# Patient Record
Sex: Female | Born: 1990 | Race: Black or African American | Hispanic: No | Marital: Single | Smoking: Current every day smoker
Health system: Southern US, Community
[De-identification: ages and names within clinical notes are randomized; demographics above are authoritative.]

## PROBLEM LIST (undated history)

## (undated) ENCOUNTER — Ambulatory Visit: Admission: EM | Payer: Self-pay | Source: Home / Self Care

## (undated) DIAGNOSIS — I10 Essential (primary) hypertension: Secondary | ICD-10-CM

## (undated) DIAGNOSIS — D649 Anemia, unspecified: Secondary | ICD-10-CM

## (undated) DIAGNOSIS — F419 Anxiety disorder, unspecified: Secondary | ICD-10-CM

## (undated) HISTORY — PX: WISDOM TOOTH EXTRACTION: SHX21

---
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
## 2010-10-05 ENCOUNTER — Ambulatory Visit: Payer: Self-pay | Admitting: Family Medicine

## 2012-04-22 ENCOUNTER — Emergency Department: Payer: Self-pay

## 2012-05-25 IMAGING — CR DG LUMBAR SPINE AP/LAT/OBLIQUES W/ FLEX AND EXT
1 series · 7 of 7 positions shown · non-contrast
Comparison: none

REASON FOR EXAM: back pain
COMMENTS:

[Series 1: view not recorded · 0.17mm/px · 7 of 7 slices shown]
[im 1/7]
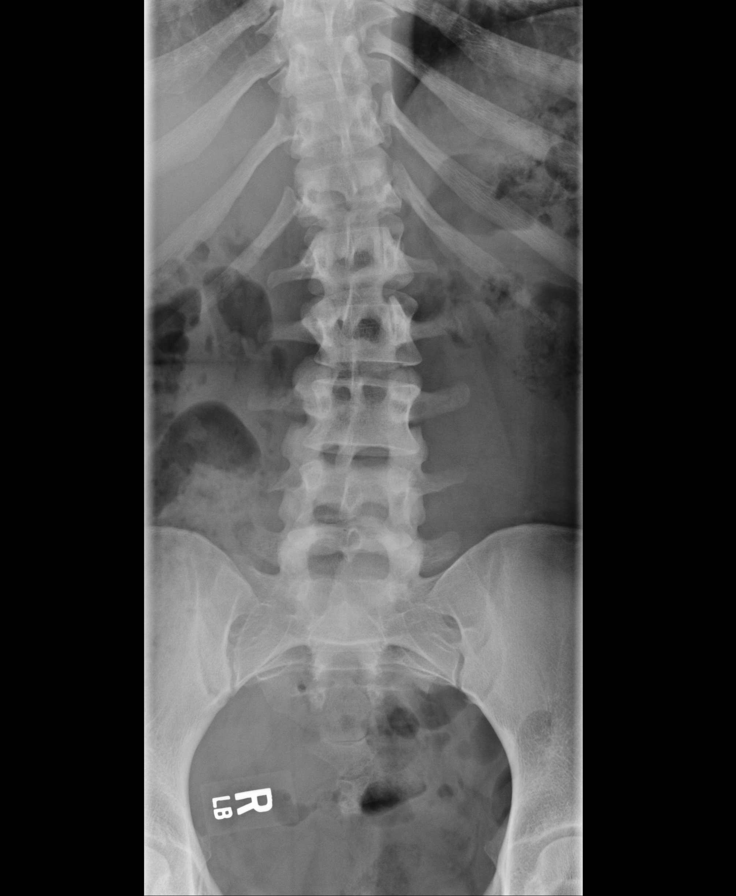
[im 2/7]
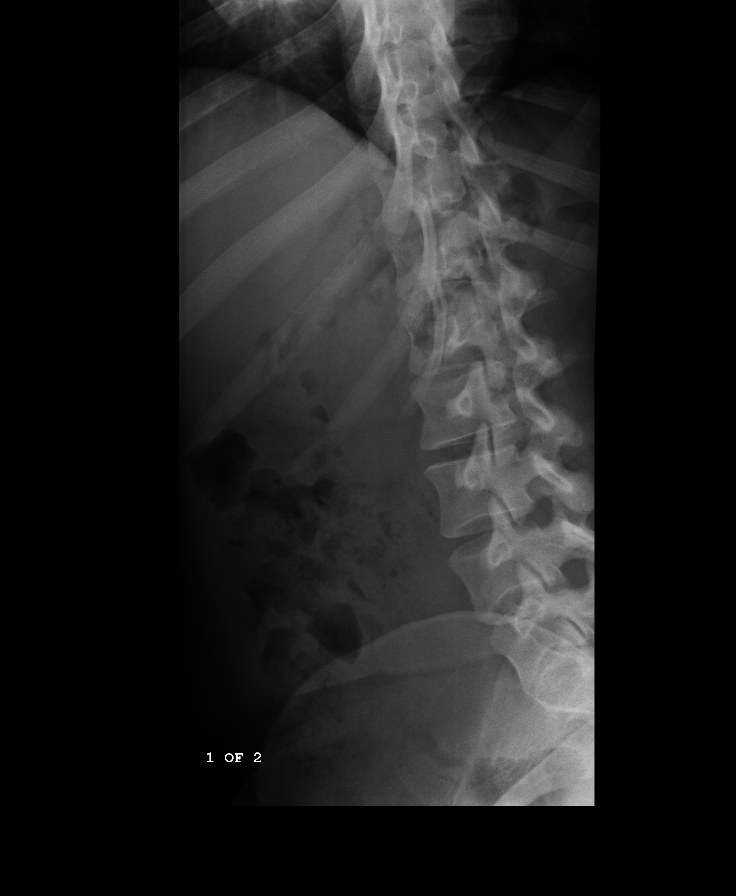
[im 3/7]
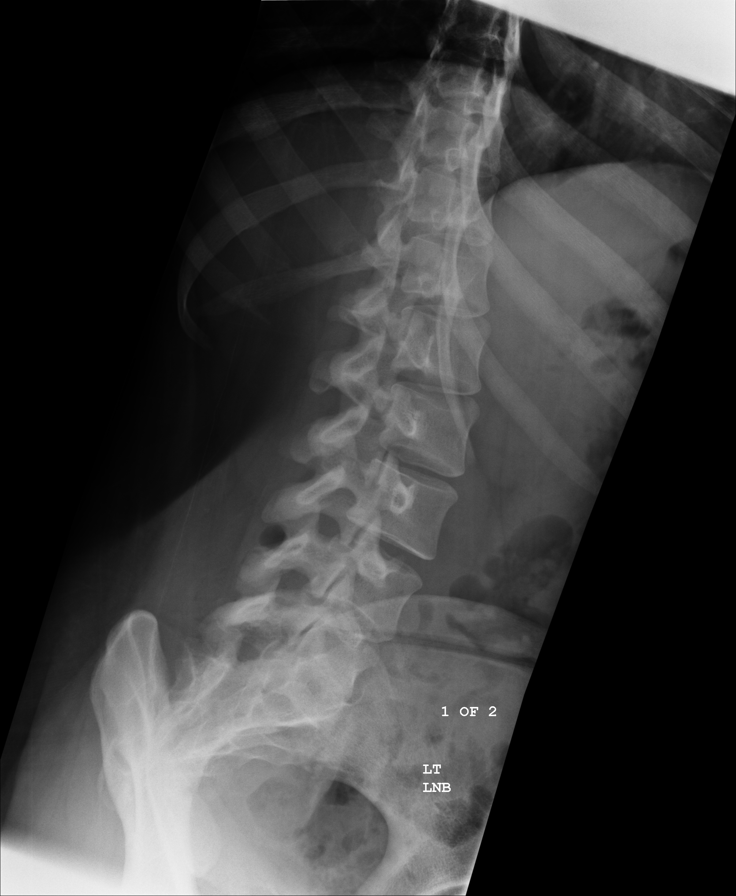
[im 4/7]
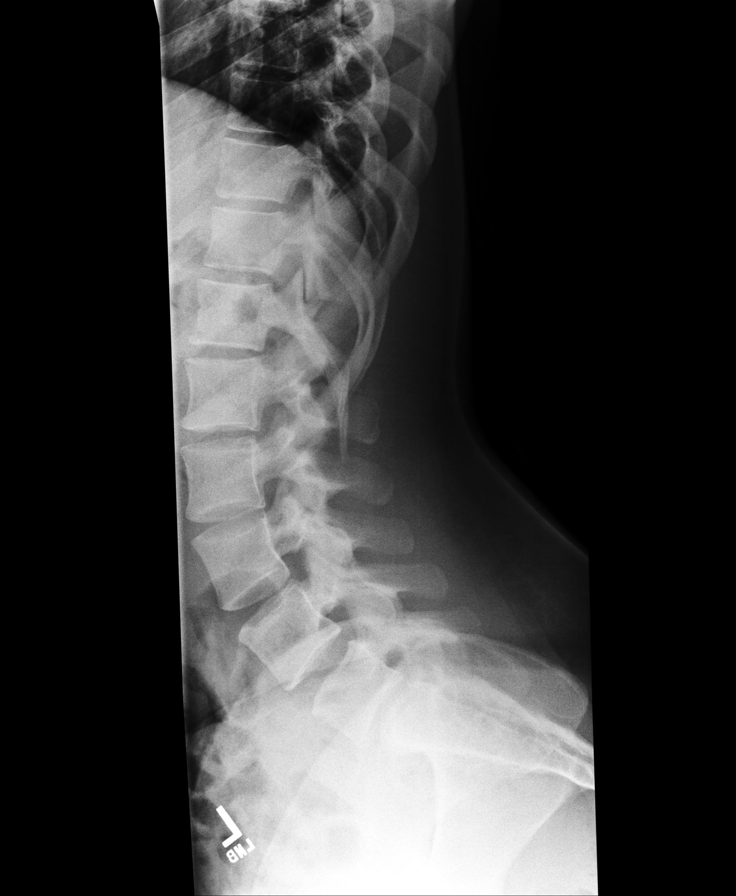
[im 5/7]
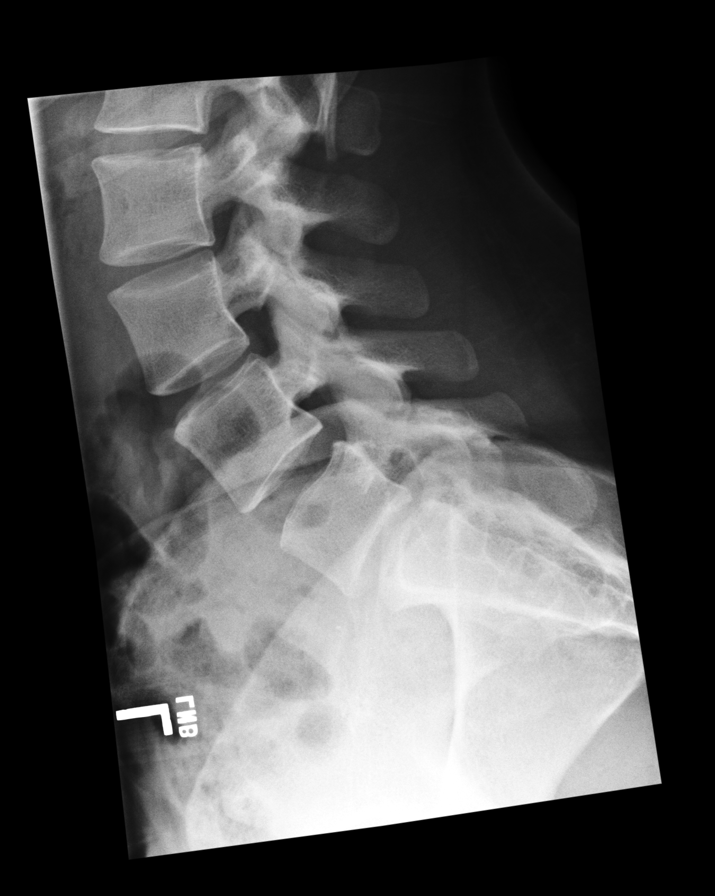
[im 6/7]
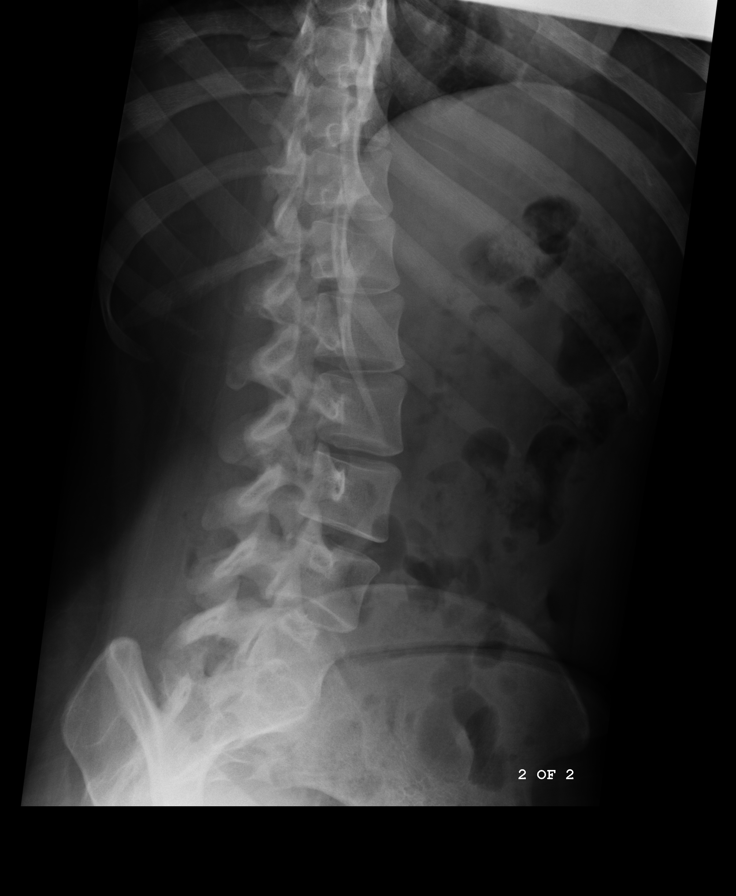
[im 7/7]
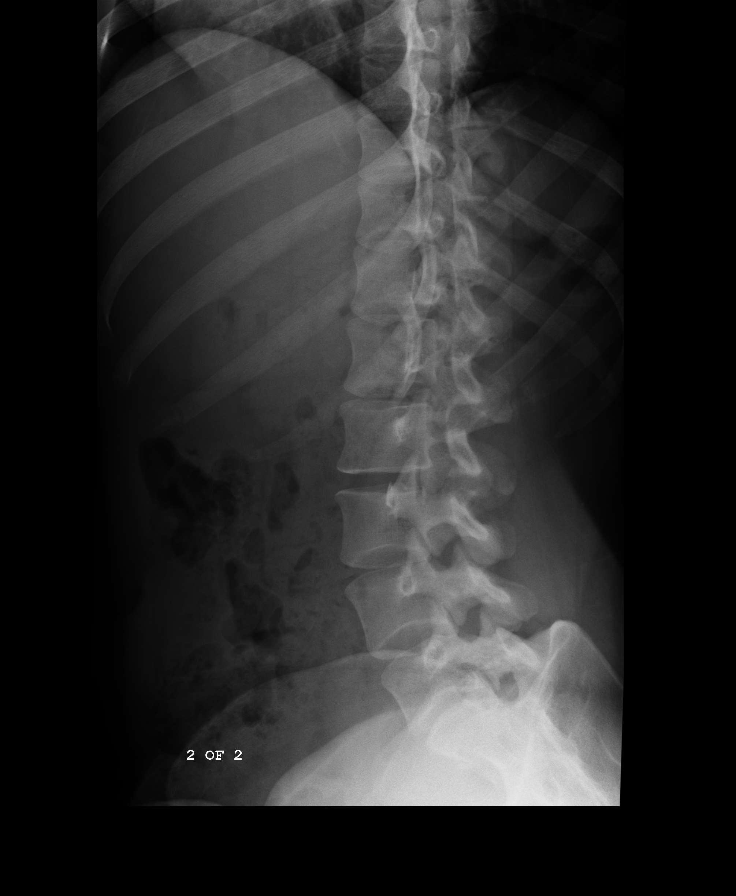

[7 of 7 positions shown; findings below may reference images not displayed]

PROCEDURE:     DXR - DXR LUMBAR SPINE WITH OBLIQUES  - October 05, 2010  [DATE]

RESULT:     The vertebral body heights and the intervertebral disc spaces
are well maintained. The vertebral body alignment is normal. Oblique views
show no significant abnormalities of the articular facets. There is a lumbar
scoliosis centered at L2 with the convexity to the left and measuring
approximately 14 degrees. The pedicles and transverse processes are normal
in appearance.
IMPRESSION: 1.  No fracture or other acute bony abnormality is identified.
2.  There is a compensatory lumbar scoliosis with a convexity to the left
and measuring approximately 14 degrees.

## 2012-05-25 IMAGING — CR DG THORACIC SPINE 2-3V
1 series · 2 of 2 positions shown · non-contrast
Comparison: none

REASON FOR EXAM: back pain
COMMENTS:

[Series 1: view not recorded · 0.17mm/px · 2 of 2 slices shown]
[im 1/2]
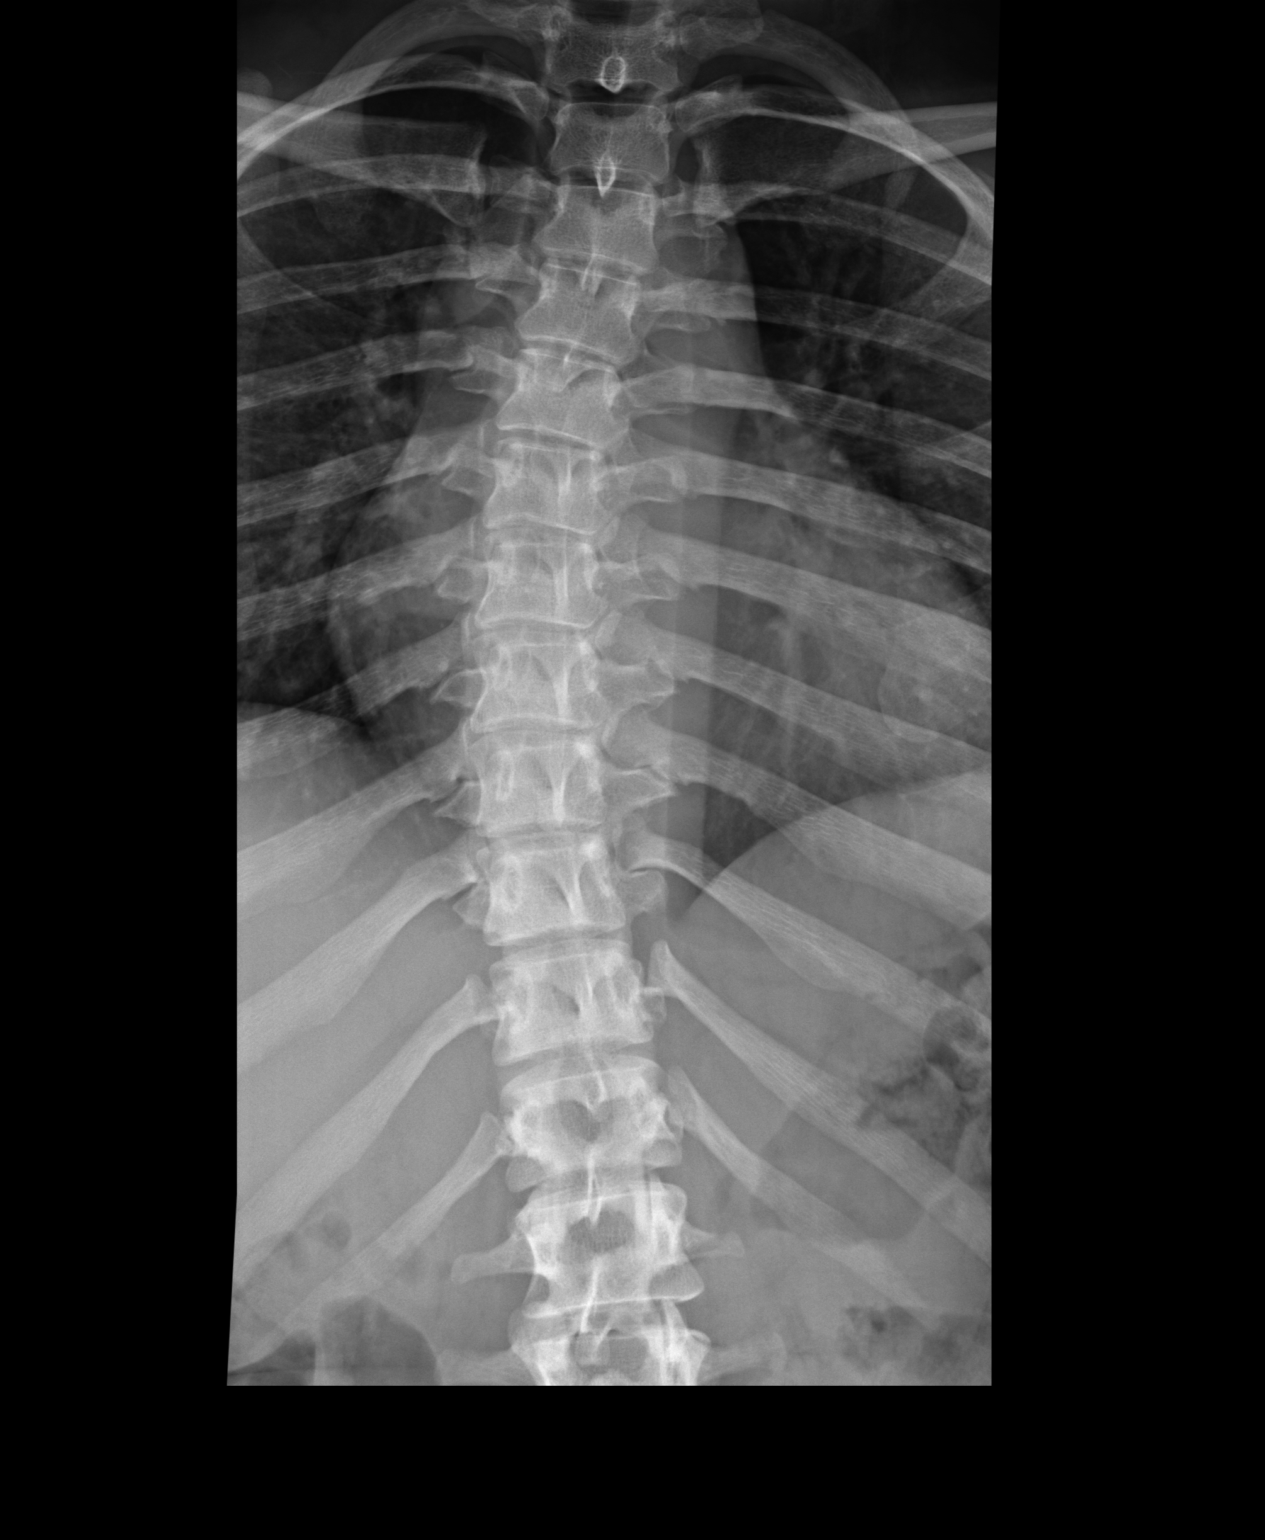
[im 2/2]
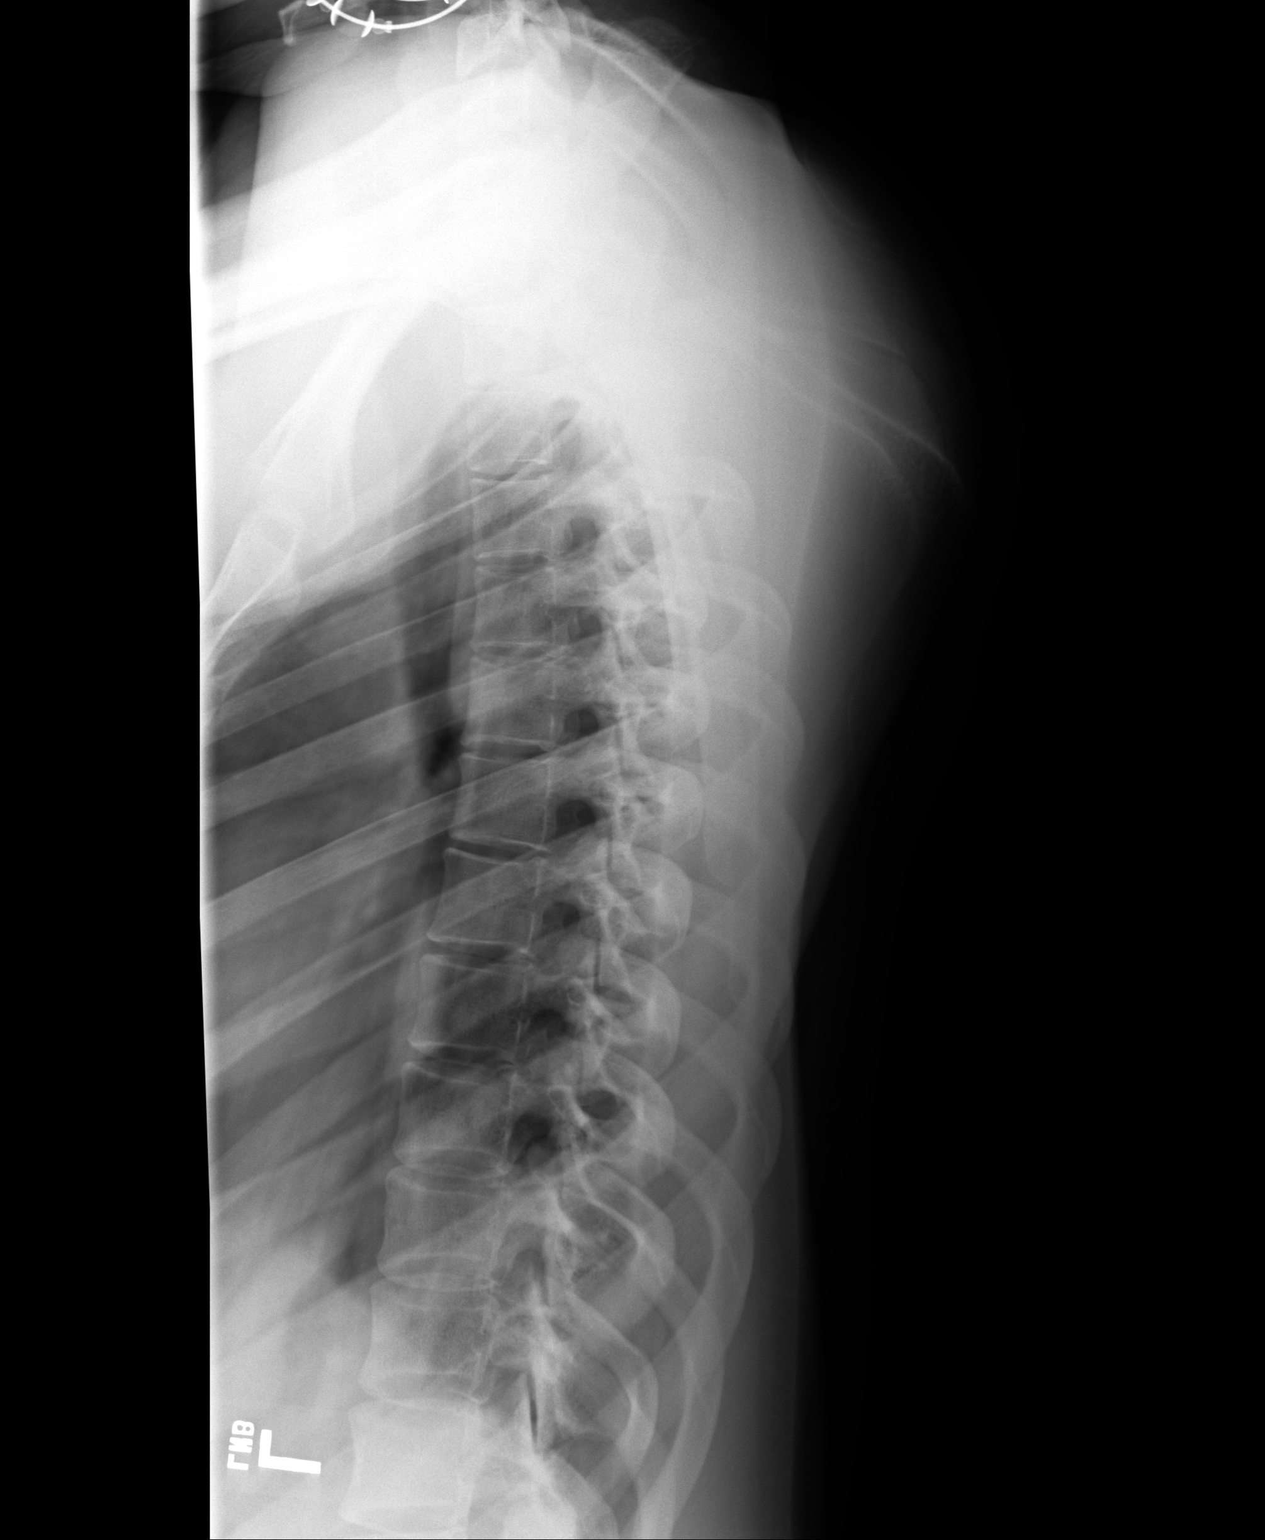

[2 of 2 positions shown; findings below may reference images not displayed]

PROCEDURE:     DXR - DXR THORACIC  AP AND LATERAL  - October 05, 2010  [DATE]

RESULT:     The vertebral body heights and the intervertebral disc spaces
are well maintained. The vertebral body alignment is normal. There is a
lumbar scoliosis, centered approximately at the T7-8 thoracic interspace and
having convexity to the right. The curvature measures approximately 16
degrees. The pedicles are bilaterally intact.
IMPRESSION: 1.  No fracture or other acute bony abnormality is identified.
2.  There is a thoracic scoliosis as noted above.

## 2013-12-19 ENCOUNTER — Emergency Department: Payer: Self-pay | Admitting: Emergency Medicine

## 2017-10-02 ENCOUNTER — Other Ambulatory Visit: Payer: Self-pay

## 2017-10-02 ENCOUNTER — Emergency Department
Admission: EM | Admit: 2017-10-02 | Discharge: 2017-10-02 | Disposition: A | Discharge status: Home / Self Care | Payer: Self-pay | Attending: Student in an Organized Health Care Education/Training Program | Admitting: Student in an Organized Health Care Education/Training Program

## 2017-10-02 ENCOUNTER — Emergency Department: Payer: Self-pay

## 2017-10-02 ENCOUNTER — Encounter: Payer: Self-pay | Admitting: Emergency Medicine

## 2017-10-02 DIAGNOSIS — R0602 Shortness of breath: Secondary | ICD-10-CM | POA: Insufficient documentation

## 2017-10-02 DIAGNOSIS — R079 Chest pain, unspecified: Secondary | ICD-10-CM | POA: Insufficient documentation

## 2017-10-02 LAB — BASIC METABOLIC PANEL
ANION GAP: 7 (ref 5–15)
BUN: 12 mg/dL (ref 6–20)
CO2: 25 mmol/L (ref 22–32)
Calcium: 8.7 mg/dL — ABNORMAL LOW (ref 8.9–10.3)
Chloride: 104 mmol/L (ref 101–111)
Creatinine, Ser: 0.92 mg/dL (ref 0.44–1.00)
GFR calc Af Amer: >60 mL/min (ref >60)
GFR calc non Af Amer: >60 mL/min (ref >60)
GLUCOSE: 88 mg/dL (ref 65–99)
POTASSIUM: 3.7 mmol/L (ref 3.5–5.1)
Sodium: 136 mmol/L (ref 135–145)

## 2017-10-02 LAB — CBC
HEMATOCRIT: 36.4 % (ref 35.0–47.0)
HEMOGLOBIN: 12.5 g/dL (ref 12.0–16.0)
MCH: 34.3 pg — AB (ref 26.0–34.0)
MCHC: 34.4 g/dL (ref 32.0–36.0)
MCV: 99.8 fL (ref 80.0–100.0)
Platelets: 256 10*3/uL (ref 150–440)
RBC: 3.65 MIL/uL — ABNORMAL LOW (ref 3.80–5.20)
RDW: 13.6 % (ref 11.5–14.5)
WBC: 4.6 10*3/uL (ref 3.6–11.0)

## 2017-10-02 LAB — TROPONIN I: Troponin I: <0.03 ng/mL (ref <0.03)

## 2017-10-02 LAB — POCT PREGNANCY, URINE: Preg Test, Ur: NEGATIVE

## 2017-10-02 LAB — FIBRIN DERIVATIVES D-DIMER (ARMC ONLY): Fibrin derivatives D-dimer (ARMC): 244.33 ng/mL (FEU) (ref 0.00–499.00)

## 2017-10-02 MED ORDER — IPRATROPIUM-ALBUTEROL 0.5-2.5 (3) MG/3ML IN SOLN
RESPIRATORY_TRACT | Status: AC
  Administered 2017-10-02: 3 mL via RESPIRATORY_TRACT
  Filled 2017-10-02: qty 3

## 2017-10-02 MED ORDER — IPRATROPIUM-ALBUTEROL 0.5-2.5 (3) MG/3ML IN SOLN
3.0000 mL | Freq: Once | RESPIRATORY_TRACT | Status: AC
  Administered 2017-10-02: 3 mL via RESPIRATORY_TRACT

## 2017-10-02 MED ORDER — ALBUTEROL SULFATE HFA 108 (90 BASE) MCG/ACT IN AERS: 2.0000 | INHALATION_SPRAY | Freq: Four times a day (QID) | RESPIRATORY_TRACT | 2 refills | Status: AC | PRN

## 2017-10-02 MED ORDER — DIAZEPAM 5 MG PO TABS: 5.0000 mg | ORAL_TABLET | Freq: Two times a day (BID) | ORAL | 0 refills | Status: AC | PRN

## 2017-10-02 NOTE — ED Triage Notes (Signed)
Pt reports that she has been having chest pain for a month mostly when she goes to bed but today when she was at work, she noticed it. States that she has had SOB and noticed fluttering in her chest. States that she notices more when she is laying down. Denies Nausea, diaphoresis or radiation.

## 2017-10-02 NOTE — ED Provider Notes (Signed)
[facility] Emergency Department Provider Note    First MD Initiated Contact with Patient 10/02/17 1740     (approximate)  I have reviewed the triage vital signs and the nursing notes.   HISTORY  Chief Complaint Chest Pain and Shortness of Breath    HPI Marijean BravoDekendra A Leventhal is a 27 y.o. female no significant past medical history presents the ER with intermittent chest pain particular at night for the past month that is gotten worse today.  States that she came to the ER today while she was at work because she started having similar chest pain shortness of breath.  Any recent travel.  Was recently on birth control pills for the past 2 months and feels that that may have coincided with her onset of symptoms.  Denies any fever or cough.   History reviewed. No pertinent past medical history. No family history on file. History reviewed. No pertinent surgical history. There are no active problems to display for this patient.     Prior to Admission medications   Medication Sig Start Date End Date Taking? Authorizing Provider  diazepam (VALIUM) 5 MG tablet Take 1 tablet (5 mg total) by mouth 3 times/day as needed-between meals & bedtime for anxiety. 10/02/17 10/02/18  Willy Eddyobinson, Khrystal Jeanmarie, MD    Allergies Patient has no allergy information on record.    Social History Social History   Tobacco Use  . Smoking status: Never Smoker  Substance Use Topics  . Alcohol use: Yes    Comment: socially  . Drug use: Not on file    Review of Systems Patient denies headaches, rhinorrhea, blurry vision, numbness, shortness of breath, chest pain, edema, cough, abdominal pain, nausea, vomiting, diarrhea, dysuria, fevers, rashes or hallucinations unless otherwise stated above in HPI. ____________________________________________   PHYSICAL EXAM:  VITAL SIGNS: Vitals:   10/02/17 1758 10/02/17 1759  BP: (!) 133/98 (!) 133/98  Pulse: 68 68  Resp:  18  Temp:    SpO2:  100% 100%    Constitutional: Alert and oriented.  Eyes: Conjunctivae are normal.  Head: Atraumatic. Nose: No congestion/rhinnorhea. Mouth/Throat: Mucous membranes are moist.   Neck: No stridor. Painless ROM.  Cardiovascular: Normal rate, regular rhythm. Grossly normal heart sounds.  Good peripheral circulation. Respiratory: Normal respiratory effort.  No retractions. Lungs CTAB. Gastrointestinal: Soft and nontender. No distention. No abdominal bruits. No CVA tenderness. Genitourinary:  Musculoskeletal: No lower extremity tenderness nor edema.  No joint effusions. Neurologic:  Normal speech and language. No gross focal neurologic deficits are appreciated. No facial droop Skin:  Skin is warm, dry and intact. No rash noted. Psychiatric: Mood and affect are normal. Speech and behavior are normal.  ____________________________________________   LABS (all labs ordered are listed, but only abnormal results are displayed)  Results for orders placed or performed during the hospital encounter of 10/02/17 (from the past 24 hour(s))  Basic metabolic panel     Status: Abnormal   Collection Time: 10/02/17  3:49 PM  Result Value Ref Range   Sodium 136 135 - 145 mmol/L   Potassium 3.7 3.5 - 5.1 mmol/L   Chloride 104 101 - 111 mmol/L   CO2 25 22 - 32 mmol/L   Glucose, Bld 88 65 - 99 mg/dL   BUN 12 6 - 20 mg/dL   Creatinine, Ser 1.610.92 0.44 - 1.00 mg/dL   Calcium 8.7 (L) 8.9 - 10.3 mg/dL   GFR calc non Af Amer >60 >60 mL/min   GFR calc Af Amer >  60 >60 mL/min   Anion gap 7 5 - 15  CBC     Status: Abnormal   Collection Time: 10/02/17  3:49 PM  Result Value Ref Range   WBC 4.6 3.6 - 11.0 K/uL   RBC 3.65 (L) 3.80 - 5.20 MIL/uL   Hemoglobin 12.5 12.0 - 16.0 g/dL   HCT 16.1 09.6 - 04.5 %   MCV 99.8 80.0 - 100.0 fL   MCH 34.3 (H) 26.0 - 34.0 pg   MCHC 34.4 32.0 - 36.0 g/dL   RDW 40.9 81.1 - 91.4 %   Platelets 256 150 - 440 K/uL  Troponin I     Status: None   Collection Time: 10/02/17  3:49 PM   Result Value Ref Range   Troponin I <0.03 <0.03 ng/mL  Pregnancy, urine POC     Status: None   Collection Time: 10/02/17  6:06 PM  Result Value Ref Range   Preg Test, Ur NEGATIVE NEGATIVE  Fibrin derivatives D-Dimer ([facility] only)     Status: None   Collection Time: 10/02/17  6:47 PM  Result Value Ref Range   Fibrin derivatives D-dimer ([facility]) 244.33 0.00 - 499.00 ng/mL (FEU)   ____________________________________________  EKG My review and personal interpretation at Time: 15:44   Indication: chest pain  Rate: 75  Rhythm: sinus Axis: normal Other: normal intervals, no stemi ____________________________________________  RADIOLOGY  I personally reviewed all radiographic images ordered to evaluate for the above acute complaints and reviewed radiology reports and findings.  These findings were personally discussed with the patient.  Please see medical record for radiology report.  ____________________________________________   PROCEDURES  Procedure(s) performed:  Procedures    Critical Care performed: no ____________________________________________   INITIAL IMPRESSION / ASSESSMENT AND PLAN / ED COURSE  Pertinent labs & imaging results that were available during my care of the patient were reviewed by me and considered in my medical decision making (see chart for details).   DDX: bronchitis, anxiety, bronchitis, pna, ptx, pe, acs  Niaomi A Belmonte is a 27 y.o. who presents to the ED with symptoms as described above.  Patient afebrile and hemodynamically stable.  No hypoxia.  EKG shows no evidence of acute ischemia.  Troponin is negative.  Patient low risk by Anner Crete but she is on birth control therefore d-dimer was sent to further stratify for pulmonary embolism.  D-dimer came back normal.  Patient states that she does feel stressed and that she has had intermittent episodes of this since she lost her father 2 years ago.  Do suspect some component of anxiety or stress-induced  symptomatology.  This point do believe she stable and appropriate for outpatient follow-up.      As part of my medical decision making, I reviewed the following data within the electronic MEDICAL RECORD NUMBER Nursing notes reviewed and incorporated, Labs reviewed, notes from prior ED visits .   ____________________________________________   FINAL CLINICAL IMPRESSION(S) / ED DIAGNOSES  Final diagnoses:  Chest pain, unspecified type      NEW MEDICATIONS STARTED DURING THIS VISIT:  New Prescriptions   DIAZEPAM (VALIUM) 5 MG TABLET    Take 1 tablet (5 mg total) by mouth 3 times/day as needed-between meals & bedtime for anxiety.     Note:  This document was prepared using Dragon voice recognition software and may include unintentional dictation errors.    Willy Eddy, MD 10/02/17 [phone]

## 2017-10-02 NOTE — ED Notes (Signed)
Pt ambulatory to POV without difficulty. VSS. NAD. Discharge instructions, RX and follow up given. All questions and concerns addressed. 

## 2019-01-15 ENCOUNTER — Other Ambulatory Visit: Payer: Self-pay

## 2019-01-15 ENCOUNTER — Emergency Department
Admission: EM | Admit: 2019-01-15 | Discharge: 2019-01-15 | Payer: Self-pay | Attending: Emergency Medicine | Admitting: Emergency Medicine

## 2019-01-15 DIAGNOSIS — Z008 Encounter for other general examination: Secondary | ICD-10-CM

## 2019-01-15 DIAGNOSIS — Z0279 Encounter for issue of other medical certificate: Secondary | ICD-10-CM | POA: Insufficient documentation

## 2019-01-15 DIAGNOSIS — F17210 Nicotine dependence, cigarettes, uncomplicated: Secondary | ICD-10-CM | POA: Insufficient documentation

## 2019-01-15 DIAGNOSIS — I10 Essential (primary) hypertension: Secondary | ICD-10-CM | POA: Insufficient documentation

## 2019-01-15 HISTORY — DX: Essential (primary) hypertension: I10

## 2019-01-15 HISTORY — DX: Anxiety disorder, unspecified: F41.9

## 2019-01-15 NOTE — ED Triage Notes (Addendum)
Patient coming to this ED in custody of Sheriff's department for COVID test. 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 Patient reports cough and runny nose X 1 week.  Patient report she has already had a negative COVID test this week. Patient is refusing to have a COVID test/swab. Sheriff's department is requesting one to clear her for jail as she refused to let them take her temperature.

## 2019-01-15 NOTE — Discharge Instructions (Addendum)
Patient was seen and evaluated in the emergency department.  Patient appears well, patient's vitals are reassuring including no fever.  She has clear lung sounds without any clinical signs of pneumonia or upper respiratory infection.  I believe the patient is safe for discharge into police custody.  Minimal risk of COVID.

## 2019-01-15 NOTE — ED Provider Notes (Signed)
[facility] Emergency Department Provider Note  Time seen: 3:30 AM  I have reviewed the triage vital signs and the nursing notes.   HISTORY  Chief Complaint COVID test   HPI Bridget Sanchez is a 28 y.o. female with a past medical history of anxiety, hypertension, presents to the emergency department for COVID evaluation.  According to police patient was placed under arrest tonight however refused to let them take her temperature at the jail and when asked if she had any covered symptoms that she had been coughing but refused to take a COVID test so they brought the patient here for evaluation.  Here patient overall appears well, vitals are reassuring including a negative temperature 98.1.  Patient states she has had a runny nose and a cough for the past week but states a week ago she had a COVID test performed that was negative per patient.  Patient denies any known COVID positive contacts although states possible exposure through her work.  Past Medical History:  Diagnosis Date  . Anxiety   . Hypertension     There are no active problems to display for this patient.   Past Surgical History:  Procedure Laterality Date  . WISDOM TOOTH EXTRACTION      Prior to Admission medications   Medication Sig Start Date End Date Taking? Authorizing Provider  albuterol (PROVENTIL HFA;VENTOLIN HFA) 108 (90 Base) MCG/ACT inhaler Inhale 2 puffs into the lungs every 6 (six) hours as needed for wheezing or shortness of breath. 10/02/17   Merlyn Lot, MD    No Known Allergies  No family history on file.  Social History Social History   Tobacco Use  . Smoking status: Current Every Day Smoker  . Smokeless tobacco: Never Used  Substance Use Topics  . Alcohol use: Yes    Comment: socially  . Drug use: Not on file    Review of Systems Constitutional: Negative for fever. Cardiovascular: Negative for chest pain. Respiratory: Negative for shortness of breath.   Cough x1 week per patient. Gastrointestinal: Negative for abdominal pain Neurological: Negative for headache All other ROS negative  ____________________________________________   PHYSICAL EXAM:  VITAL SIGNS: ED Triage Vitals  Enc Vitals Group     BP 01/15/19 0259 (!) 159/93     Pulse Rate 01/15/19 0259 (!) 110     Resp 01/15/19 0259 19     Temp 01/15/19 0259 98.1 F (36.7 C)     Temp Source 01/15/19 0259 Oral     SpO2 01/15/19 0259 99 %     Weight 01/15/19 0300 175 lb (79.4 kg)     Height 01/15/19 0300 5\' 8"  (1.727 m)     Head Circumference --      Peak Flow --      Pain Score 01/15/19 0300 0     Pain Loc --      Pain Edu? --      Excl. in [place]? --    Constitutional: Alert and oriented. Well appearing and in no distress.  Tearful. Eyes: Normal exam ENT      Head: Normocephalic and atraumatic      Mouth/Throat: Mucous membranes are moist. Cardiovascular: Normal rate, regular rhythm.  Respiratory: Normal respiratory effort without tachypnea nor retractions. Breath sounds are clear Gastrointestinal: Soft and nontender. No distention. Musculoskeletal: Nontender with normal range of motion in all extremities.  Neurologic:  Normal speech and language. No gross focal neurologic deficits  Skin:  Skin is warm, dry and intact.  Psychiatric: Mood and affect are normal.   ____________________________________________   INITIAL IMPRESSION / ASSESSMENT AND PLAN / ED COURSE  Pertinent labs & imaging results that were available during my care of the patient were reviewed by me and considered in my medical decision making (see chart for details).   Patient presents emergency department by police for COVID evaluation.  They state the patient refused to allow them to check her temperature and they could not clear her for jail so they brought her here.  Here the patient allowed [place] to take vitals, patient is afebrile.  Allowed a physical exam which shows clear lung sounds without any sign  of wheeze rales or rhonchi.  I offered to repeat the COVID test here, patient continues to refuse to cover test states she had one a week ago and it hurt and she does not want another one.  At this time given her normal physical exam with clear lung sounds no cough at least during my evaluation and afebrile vitals I believe the patient is safe for discharge into police custody.  Bridget Sanchez was evaluated in Emergency Department on 01/15/2019 for the symptoms described in the history of present illness. She was evaluated in the context of the global COVID-19 pandemic, which necessitated consideration that the patient might be at risk for infection with the SARS-CoV-2 virus that causes COVID-19. Institutional protocols and algorithms that pertain to the evaluation of patients at risk for COVID-19 are in a state of rapid change based on information released by regulatory bodies including the CDC and federal and state organizations. These policies and algorithms were followed during the patient's care in the ED.  ____________________________________________   FINAL CLINICAL IMPRESSION(S) / ED DIAGNOSES  Medical clearance   Minna Antis, MD 01/15/19 [phone]

## 2019-01-15 NOTE — ED Notes (Signed)
Pt sitting in exam room with no distress noted, mask in place; in custody of Therapist, nutritional who [facility] jail is requiring COVID test as pt reported recent cough & congestion; pt reports -COVID test this wk and refuses another; resp even/unlab, lungs clear

## 2019-03-22 ENCOUNTER — Other Ambulatory Visit: Payer: Self-pay

## 2019-03-22 DIAGNOSIS — Z20822 Contact with and (suspected) exposure to covid-19: Secondary | ICD-10-CM

## 2019-03-23 LAB — NOVEL CORONAVIRUS, NAA: SARS-CoV-2, NAA: DETECTED — AB

## 2019-07-09 ENCOUNTER — Emergency Department
Admission: EM | Admit: 2019-07-09 | Discharge: 2019-07-10 | Disposition: A | Discharge status: Home / Self Care | Payer: Self-pay | Attending: Emergency Medicine | Admitting: Emergency Medicine

## 2019-07-09 ENCOUNTER — Other Ambulatory Visit: Payer: Self-pay

## 2019-07-09 DIAGNOSIS — Y908 Blood alcohol level of 240 mg/100 ml or more: Secondary | ICD-10-CM | POA: Insufficient documentation

## 2019-07-09 DIAGNOSIS — R4589 Other symptoms and signs involving emotional state: Secondary | ICD-10-CM

## 2019-07-09 DIAGNOSIS — R45850 Homicidal ideations: Secondary | ICD-10-CM

## 2019-07-09 DIAGNOSIS — F10929 Alcohol use, unspecified with intoxication, unspecified: Secondary | ICD-10-CM | POA: Diagnosis present

## 2019-07-09 DIAGNOSIS — F17210 Nicotine dependence, cigarettes, uncomplicated: Secondary | ICD-10-CM | POA: Insufficient documentation

## 2019-07-09 DIAGNOSIS — F329 Major depressive disorder, single episode, unspecified: Secondary | ICD-10-CM | POA: Insufficient documentation

## 2019-07-09 DIAGNOSIS — R45851 Suicidal ideations: Secondary | ICD-10-CM | POA: Insufficient documentation

## 2019-07-09 DIAGNOSIS — F1094 Alcohol use, unspecified with alcohol-induced mood disorder: Secondary | ICD-10-CM | POA: Diagnosis present

## 2019-07-09 DIAGNOSIS — F10920 Alcohol use, unspecified with intoxication, uncomplicated: Secondary | ICD-10-CM

## 2019-07-09 DIAGNOSIS — I10 Essential (primary) hypertension: Secondary | ICD-10-CM | POA: Insufficient documentation

## 2019-07-09 LAB — ACETAMINOPHEN LEVEL: Acetaminophen (Tylenol), Serum: <10 ug/mL — ABNORMAL LOW (ref 10–30)

## 2019-07-09 LAB — COMPREHENSIVE METABOLIC PANEL
ALT: 15 U/L (ref 0–44)
AST: 32 U/L (ref 15–41)
Albumin: 4.7 g/dL (ref 3.5–5.0)
Alkaline Phosphatase: 31 U/L — ABNORMAL LOW (ref 38–126)
Anion gap: 14 (ref 5–15)
BUN: 10 mg/dL (ref 6–20)
CO2: 20 mmol/L — ABNORMAL LOW (ref 22–32)
Calcium: 9.1 mg/dL (ref 8.9–10.3)
Chloride: 107 mmol/L (ref 98–111)
Creatinine, Ser: 0.98 mg/dL (ref 0.44–1.00)
GFR calc Af Amer: >60 mL/min (ref >60)
GFR calc non Af Amer: >60 mL/min (ref >60)
Glucose, Bld: 91 mg/dL (ref 70–99)
Potassium: 3.4 mmol/L — ABNORMAL LOW (ref 3.5–5.1)
Sodium: 141 mmol/L (ref 135–145)
Total Bilirubin: 0.3 mg/dL (ref 0.3–1.2)
Total Protein: 8.3 g/dL — ABNORMAL HIGH (ref 6.5–8.1)

## 2019-07-09 LAB — URINE DRUG SCREEN, QUALITATIVE (ARMC ONLY)
Amphetamines, Ur Screen: NOT DETECTED
Barbiturates, Ur Screen: NOT DETECTED
Benzodiazepine, Ur Scrn: NOT DETECTED
Cannabinoid 50 Ng, Ur ~~LOC~~: POSITIVE — AB
Cocaine Metabolite,Ur ~~LOC~~: NOT DETECTED
MDMA (Ecstasy)Ur Screen: NOT DETECTED
Methadone Scn, Ur: NOT DETECTED
Opiate, Ur Screen: NOT DETECTED
Phencyclidine (PCP) Ur S: NOT DETECTED
Tricyclic, Ur Screen: NOT DETECTED

## 2019-07-09 LAB — CBC
HCT: 34.1 % — ABNORMAL LOW (ref 36.0–46.0)
Hemoglobin: 10.8 g/dL — ABNORMAL LOW (ref 12.0–15.0)
MCH: 29.8 pg (ref 26.0–34.0)
MCHC: 31.7 g/dL (ref 30.0–36.0)
MCV: 93.9 fL (ref 80.0–100.0)
Platelets: 372 10*3/uL (ref 150–400)
RBC: 3.63 MIL/uL — ABNORMAL LOW (ref 3.87–5.11)
RDW: 14.7 % (ref 11.5–15.5)
WBC: 7.6 10*3/uL (ref 4.0–10.5)
nRBC: 0 % (ref 0.0–0.2)

## 2019-07-09 LAB — ETHANOL: Alcohol, Ethyl (B): 291 mg/dL — ABNORMAL HIGH (ref <10)

## 2019-07-09 LAB — SALICYLATE LEVEL: Salicylate Lvl: <7 mg/dL — ABNORMAL LOW (ref 7.0–30.0)

## 2019-07-09 NOTE — ED Notes (Signed)
Pt. Requested and was given cup of ginger ale.  Pt. Given policy on changing into hospital approved attire.  Pt. Here under IVC.  Pt. Denies SI/HI and A/VH.  Pt. Tearful upon interview and asking how long process will be.  Pt. Told staff and this nurse would do everything possible to move process along while maintaining safe environment.
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
## 2019-07-09 NOTE — ED Notes (Addendum)
Belongings placed in labeled belongings bag to be secured on the unit.  Black colored wallet, cell phone, red colored hoodie with white colored letters (L, A), jeans, tennis shoes teal colored and pink colored, gray colored bra, multicolored underwear.

## 2019-07-09 NOTE — ED Notes (Signed)
Patient not wanting to dress out in approved hospital attire because it is not specific in that it states patient must take off all of her clothing including bra and underwear.  Explained to patient would be provided scrubs, underwear, and socks but no bra as it could be used to harm self or others.

## 2019-07-10 DIAGNOSIS — F10920 Alcohol use, unspecified with intoxication, uncomplicated: Secondary | ICD-10-CM

## 2019-07-10 DIAGNOSIS — F1094 Alcohol use, unspecified with alcohol-induced mood disorder: Secondary | ICD-10-CM

## 2019-07-10 DIAGNOSIS — R45850 Homicidal ideations: Secondary | ICD-10-CM

## 2019-07-10 DIAGNOSIS — R4589 Other symptoms and signs involving emotional state: Secondary | ICD-10-CM

## 2019-07-10 DIAGNOSIS — F10929 Alcohol use, unspecified with intoxication, unspecified: Secondary | ICD-10-CM | POA: Diagnosis present

## 2019-07-10 NOTE — Discharge Instructions (Signed)
[facility] (Mental Health & [facility]) & [facility]  Mental health service in [place],  Address: [address], [place], [place] [address] Hours:  Closed ? Opens 8AM Mon Phone: [phone] 

## 2019-07-10 NOTE — ED Notes (Signed)
This nurse returned patients phone so patient could reschedule appointments.  Pt. Is a Copywriter, advertising.  Pt. Allowed to only use text feature for 10 minutes.  Pt. Also given policy on visitation and phone use.
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
## 2019-07-10 NOTE — ED Notes (Signed)
NP Talking to patient in room.

## 2019-07-10 NOTE — ED Provider Notes (Signed)
[facility] Emergency Department Provider Note  ____________________________________________  Time seen: Approximately 1:29 AM  I have reviewed the triage vital signs and the nursing notes.   HISTORY  Chief Complaint Psychiatric Evaluation   HPI Bridget Sanchez is a 29 y.o. female with a history of anxiety and hypertension who is brought in by police  under IVC for making statements about killing herself, her boyfriend and his girlfriend. Patient is intoxicated with positive alcohol level and UDS positive for cannabinoids. Patient is refusing to talk to me at this time and lays in bed with her eyes closed. Will reassess at a later time.  _________________________ 2:45 AM on 07/10/2019 -----------------------------------------  Patient is now awake and able to talk to [place].  She reports that she was at a friend's house when the cops showed up with an IVC order to bring her to the hospital.  She reports that she has no idea who called or took IVC papers on her.  She denies having a boyfriend or ex-boyfriend.  She denies making any threats about her life for someone else's life.  She denies any suicidal homicidal thoughts.  She denies any other drug use other than marijuana and alcohol.  She denies any prior history of suicide attempt.  Past Medical History:  Diagnosis Date  . Anxiety   . Hypertension     Past Surgical History:  Procedure Laterality Date  . WISDOM TOOTH EXTRACTION      Prior to Admission medications   Medication Sig Start Date End Date Taking? Authorizing Provider  albuterol (PROVENTIL HFA;VENTOLIN HFA) 108 (90 Base) MCG/ACT inhaler Inhale 2 puffs into the lungs every 6 (six) hours as needed for wheezing or shortness of breath. 10/02/17   Merlyn Lot, MD    Allergies Patient has no known allergies.  No family history on file.  Social History Social History   Tobacco Use  . Smoking status: Current Every Day Smoker  .  Smokeless tobacco: Never Used  Substance Use Topics  . Alcohol use: Yes    Comment: socially  . Drug use: Not on file    Review of Systems  Constitutional: Negative for fever. Eyes: Negative for visual changes. ENT: Negative for sore throat. Neck: No neck pain  Cardiovascular: Negative for chest pain. Respiratory: Negative for shortness of breath. Gastrointestinal: Negative for abdominal pain, vomiting or diarrhea. Genitourinary: Negative for dysuria. Musculoskeletal: Negative for back pain. Skin: Negative for rash. Neurological: Negative for headaches, weakness or numbness. Psych: No SI or HI  ____________________________________________   PHYSICAL EXAM:  VITAL SIGNS: ED Triage Vitals  Enc Vitals Group     BP 07/09/19 2222 (!) 144/105     Pulse Rate 07/09/19 2222 (!) 141     Resp 07/09/19 2222 18     Temp 07/09/19 2222 98.9 F (37.2 C)     Temp Source 07/09/19 2222 Oral     SpO2 07/09/19 2222 95 %     Weight 07/09/19 2231 175 lb (79.4 kg)     Height 07/09/19 2231 5\' 8"  (1.727 m)     Head Circumference --      Peak Flow --      Pain Score 07/09/19 2230 0     Pain Loc --      Pain Edu? --      Excl. in [place]? --     Constitutional: Alert and oriented. Well appearing and in no apparent distress. HEENT:      Head: Normocephalic and  atraumatic.         Eyes: Conjunctivae are normal. Sclera is non-icteric.       Mouth/Throat: Mucous membranes are moist.       Neck: Supple with no signs of meningismus. Cardiovascular: Regular rate and rhythm.  Respiratory: Normal respiratory effort.  Gastrointestinal: Soft, non tender, and non distended. Musculoskeletal: No edema, cyanosis, or erythema of extremities. Neurologic: Normal speech and language. Face is symmetric. Moving all extremities. No gross focal neurologic deficits are appreciated. Skin: Skin is warm, dry and intact. No rash noted. Psychiatric: Mood and affect are normal. Speech and behavior are  normal.  ____________________________________________   LABS (all labs ordered are listed, but only abnormal results are displayed)  Labs Reviewed  COMPREHENSIVE METABOLIC PANEL - Abnormal; Notable for the following components:      Result Value   Potassium 3.4 (*)    CO2 20 (*)    Total Protein 8.3 (*)    Alkaline Phosphatase 31 (*)    All other components within normal limits  ETHANOL - Abnormal; Notable for the following components:   Alcohol, Ethyl (B) 291 (*)    All other components within normal limits  SALICYLATE LEVEL - Abnormal; Notable for the following components:   Salicylate Lvl <7.0 (*)    All other components within normal limits  ACETAMINOPHEN LEVEL - Abnormal; Notable for the following components:   Acetaminophen (Tylenol), Serum <10 (*)    All other components within normal limits  CBC - Abnormal; Notable for the following components:   RBC 3.63 (*)    Hemoglobin 10.8 (*)    HCT 34.1 (*)    All other components within normal limits  URINE DRUG SCREEN, QUALITATIVE ([facility] ONLY) - Abnormal; Notable for the following components:   Cannabinoid 50 Ng, Ur [place] POSITIVE (*)    All other components within normal limits  POC URINE PREG, ED   ____________________________________________  EKG  none  ____________________________________________  RADIOLOGY  none  ____________________________________________   PROCEDURES  Procedure(s) performed: None Procedures Critical Care performed:  None ____________________________________________   INITIAL IMPRESSION / ASSESSMENT AND PLAN / ED COURSE  29 y.o. female with a history of anxiety and hypertension who is brought in by police  under IVC for making statements about killing herself, her boyfriend and his girlfriend.  Patient denies suicidal homicidal ideation, denies making threats to anyone.  She is able to contract for her safety.  Labs for medical clearance showing positive alcohol level of 291 and positive  UDS for cannabinoids.  Psych has been consulted.  Patient is medically clear.  The patient has been placed in psychiatric observation due to the need to provide a safe environment for the patient while obtaining psychiatric consultation and evaluation, as well as ongoing medical and medication management to treat the patient's condition.  The patient has been placed under full IVC at this time.       Please note:  Patient was evaluated in Emergency Department today for the symptoms described in the history of present illness. Patient was evaluated in the context of the global COVID-19 pandemic, which necessitated consideration that the patient might be at risk for infection with the SARS-CoV-2 virus that causes COVID-19. Institutional protocols and algorithms that pertain to the evaluation of patients at risk for COVID-19 are in a state of rapid change based on information released by regulatory bodies including the CDC and federal and state organizations. These policies and algorithms were followed during the patient's care in the  ED.  Some ED evaluations and interventions may be delayed as a result of limited staffing during the pandemic.   As part of my medical decision making, I reviewed the following data within the electronic MEDICAL RECORD NUMBER Nursing notes reviewed and incorporated, Labs reviewed , Old chart reviewed, A consult was requested and obtained from this/these consultant(s) psychiatry, Notes from prior ED visits and [place] Controlled Substance Database   ____________________________________________   FINAL CLINICAL IMPRESSION(S) / ED DIAGNOSES   Final diagnoses:  Alcoholic intoxication with complication (HCC)      NEW MEDICATIONS STARTED DURING THIS VISIT:  ED Discharge Orders    None       Note:  This document was prepared using Dragon voice recognition software and may include unintentional dictation errors.    Don Perking, [place], MD 07/10/19 [phone]
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
## 2019-07-10 NOTE — BH Assessment (Signed)
Patient refusing to talk to anyone at this time. Patient BAL is 291 and UDS is positive for cannabis. Will attempt to reassess at later time.
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
## 2019-07-10 NOTE — ED Notes (Signed)
Pt. Woke up, MD notified.  MD talking to patient in room along with this nurse.  Pt. Denies SI/HI.

## 2019-12-24 ENCOUNTER — Other Ambulatory Visit
Admission: AD | Admit: 2019-12-24 | Discharge: 2019-12-24 | Disposition: A | Discharge status: Home / Self Care | Attending: Family Medicine | Admitting: Family Medicine

## 2019-12-25 ENCOUNTER — Other Ambulatory Visit: Payer: Self-pay

## 2019-12-25 ENCOUNTER — Emergency Department
Admission: EM | Admit: 2019-12-25 | Discharge: 2019-12-25 | Disposition: A | Discharge status: Home / Self Care | Attending: Emergency Medicine | Admitting: Emergency Medicine

## 2019-12-25 DIAGNOSIS — I10 Essential (primary) hypertension: Secondary | ICD-10-CM | POA: Insufficient documentation

## 2019-12-25 DIAGNOSIS — Y939 Activity, unspecified: Secondary | ICD-10-CM | POA: Insufficient documentation

## 2019-12-25 DIAGNOSIS — F419 Anxiety disorder, unspecified: Secondary | ICD-10-CM | POA: Insufficient documentation

## 2019-12-25 DIAGNOSIS — Z008 Encounter for other general examination: Secondary | ICD-10-CM

## 2019-12-25 DIAGNOSIS — F17200 Nicotine dependence, unspecified, uncomplicated: Secondary | ICD-10-CM | POA: Insufficient documentation

## 2019-12-25 DIAGNOSIS — Y999 Unspecified external cause status: Secondary | ICD-10-CM | POA: Insufficient documentation

## 2019-12-25 DIAGNOSIS — Y92410 Unspecified street and highway as the place of occurrence of the external cause: Secondary | ICD-10-CM | POA: Insufficient documentation

## 2019-12-25 MED ORDER — ACETAMINOPHEN 325 MG PO TABS: 650.0000 mg | ORAL_TABLET | Freq: Once | ORAL | Status: AC

## 2019-12-25 MED ORDER — ACETAMINOPHEN 325 MG PO TABS
ORAL_TABLET | ORAL | Status: AC
  Administered 2019-12-25: 650 mg via ORAL
  Filled 2019-12-25: qty 2
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
## 2020-02-25 ENCOUNTER — Other Ambulatory Visit: Payer: Self-pay

## 2020-02-25 ENCOUNTER — Emergency Department

## 2020-02-25 DIAGNOSIS — F17210 Nicotine dependence, cigarettes, uncomplicated: Secondary | ICD-10-CM | POA: Insufficient documentation

## 2020-02-25 DIAGNOSIS — I10 Essential (primary) hypertension: Secondary | ICD-10-CM | POA: Insufficient documentation

## 2020-02-25 DIAGNOSIS — S161XXA Strain of muscle, fascia and tendon at neck level, initial encounter: Secondary | ICD-10-CM | POA: Insufficient documentation

## 2020-02-25 DIAGNOSIS — S01419A Laceration without foreign body of unspecified cheek and temporomandibular area, initial encounter: Secondary | ICD-10-CM | POA: Insufficient documentation

## 2020-02-25 DIAGNOSIS — Z23 Encounter for immunization: Secondary | ICD-10-CM | POA: Insufficient documentation

## 2020-02-25 DIAGNOSIS — W228XXA Striking against or struck by other objects, initial encounter: Secondary | ICD-10-CM | POA: Insufficient documentation

## 2020-02-25 DIAGNOSIS — S80819A Abrasion, unspecified lower leg, initial encounter: Secondary | ICD-10-CM | POA: Insufficient documentation

## 2020-02-25 DIAGNOSIS — W19XXXA Unspecified fall, initial encounter: Secondary | ICD-10-CM | POA: Insufficient documentation

## 2020-02-25 LAB — POC URINE PREG, ED: Preg Test, Ur: NEGATIVE

## 2020-02-25 MED ORDER — ACETAMINOPHEN 325 MG PO TABS
650.0000 mg | ORAL_TABLET | Freq: Once | ORAL | Status: AC
  Administered 2020-02-25: 650 mg via ORAL
  Filled 2020-02-25: qty 2

## 2020-02-25 MED ORDER — ONDANSETRON 4 MG PO TBDP
4.0000 mg | ORAL_TABLET | Freq: Once | ORAL | Status: AC | PRN
  Administered 2020-02-25: 4 mg via ORAL
  Filled 2020-02-25: qty 1

## 2020-02-25 NOTE — ED Notes (Signed)
Spoke with MD regarding pt presentation and complaints. See orders
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
## 2020-02-26 ENCOUNTER — Emergency Department
Admission: EM | Admit: 2020-02-26 | Discharge: 2020-02-26 | Disposition: A | Discharge status: Home / Self Care | Attending: Emergency Medicine | Admitting: Emergency Medicine

## 2020-02-26 DIAGNOSIS — S161XXA Strain of muscle, fascia and tendon at neck level, initial encounter: Secondary | ICD-10-CM

## 2020-02-26 DIAGNOSIS — S0181XA Laceration without foreign body of other part of head, initial encounter: Secondary | ICD-10-CM

## 2020-02-26 DIAGNOSIS — W19XXXA Unspecified fall, initial encounter: Secondary | ICD-10-CM

## 2020-02-26 DIAGNOSIS — T07XXXA Unspecified multiple injuries, initial encounter: Secondary | ICD-10-CM

## 2020-02-26 HISTORY — DX: Anemia, unspecified: D64.9

## 2020-02-26 MED ORDER — CYCLOBENZAPRINE HCL 5 MG PO TABS: ORAL_TABLET | ORAL | 0 refills | Status: AC

## 2020-02-26 MED ORDER — IBUPROFEN 800 MG PO TABS
800.0000 mg | ORAL_TABLET | Freq: Once | ORAL | Status: AC
  Administered 2020-02-26: 800 mg via ORAL
  Filled 2020-02-26: qty 1

## 2020-02-26 MED ORDER — ACETAMINOPHEN 325 MG PO TABS
ORAL_TABLET | ORAL | Status: AC
  Filled 2020-02-26: qty 2

## 2020-02-26 MED ORDER — IBUPROFEN 800 MG PO TABS: 800.0000 mg | ORAL_TABLET | Freq: Three times a day (TID) | ORAL | 0 refills | Status: AC | PRN

## 2020-02-26 MED ORDER — ACETAMINOPHEN 325 MG PO TABS
650.0000 mg | ORAL_TABLET | Freq: Once | ORAL | Status: AC
  Administered 2020-02-26: 650 mg via ORAL

## 2020-02-26 MED ORDER — TETANUS-DIPHTH-ACELL PERTUSSIS 5-2.5-18.5 LF-MCG/0.5 IM SUSY
0.5000 mL | PREFILLED_SYRINGE | Freq: Once | INTRAMUSCULAR | Status: AC
  Administered 2020-02-26: 0.5 mL via INTRAMUSCULAR
  Filled 2020-02-26: qty 0.5

## 2020-02-26 MED ORDER — CYCLOBENZAPRINE HCL 10 MG PO TABS
5.0000 mg | ORAL_TABLET | Freq: Once | ORAL | Status: AC
  Administered 2020-02-26: 5 mg via ORAL
  Filled 2020-02-26: qty 1

## 2020-02-26 MED ORDER — HYDROCODONE-ACETAMINOPHEN 5-325 MG PO TABS
1.0000 | ORAL_TABLET | Freq: Once | ORAL | Status: AC
  Administered 2020-02-26: 1 via ORAL
  Filled 2020-02-26: qty 1

## 2020-02-26 MED ORDER — HYDROCODONE-ACETAMINOPHEN 5-325 MG PO TABS: 1.0000 | ORAL_TABLET | Freq: Four times a day (QID) | ORAL | 0 refills | Status: AC | PRN

## 2020-02-26 NOTE — ED Notes (Signed)
Wound cleaned and steri-strip applied per order. Pt instructed on wound and steri strip care and pt verbalized understanding of all teaching.

## 2020-02-26 NOTE — ED Provider Notes (Signed)
[facility] Emergency Department Provider Note   ____________________________________________   First MD Initiated Contact with Patient 02/26/20 0411     (approximate)  I have reviewed the triage vital signs and the nursing notes.   HISTORY  Chief Complaint Fall    HPI Bridget Sanchez is a 29 y.o. female who presents to the ED from home with a chief complaint of fall with facial abrasions and neck pain.  Patient reports being pulled by her dog around 11 AM yesterday morning.  She struck her face on a railing of a ramp.  Denies LOC.  Presents with facial laceration, scattered abrasions to her face and hands and neck pain.  Denies vision changes, chest pain, shortness of breath, abdominal pain, nausea, vomiting or dizziness.  Tetanus is not up-to-date.  Patient does not take anticoagulants.     Past Medical History:  Diagnosis Date  . Anemia   . Anxiety   . Hypertension     Patient Active Problem List   Diagnosis Date Noted  . 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 Alcohol intoxication (HCC)     Past Surgical History:  Procedure Laterality Date  . WISDOM TOOTH EXTRACTION      Prior to Admission medications   Medication Sig Start Date End Date Taking? Authorizing Provider  albuterol (PROVENTIL HFA;VENTOLIN HFA) 108 (90 Base) MCG/ACT inhaler Inhale 2 puffs into the lungs every 6 (six) hours as needed for wheezing or shortness of breath. 10/02/17   Willy Eddy, MD    Allergies Patient has no known allergies.  No family history on file.  Social History Social History   Tobacco Use  . Smoking status: Current Every Day Smoker  . Smokeless tobacco: Never Used  Substance Use Topics  . Alcohol use: Yes    Comment: socially  . Drug use: Yes    Types: Marijuana    Review of Systems  Constitutional: No fever/chills Eyes: No visual changes. ENT: Positive for facial laceration.  No sore throat. Cardiovascular: Denies chest  pain. Respiratory: Denies shortness of breath. Gastrointestinal: No abdominal pain.  No nausea, no vomiting.  No diarrhea.  No constipation. Genitourinary: Negative for dysuria. Musculoskeletal: Positive for neck pain.  Negative for back pain. Skin: Positive for scattered abrasions.  Negative for rash. Neurological: Negative for headaches, focal weakness or numbness.   ____________________________________________   PHYSICAL EXAM:  VITAL SIGNS: ED Triage Vitals [02/25/20 2207]  Enc Vitals Group     BP 137/89     Pulse Rate 86     Resp 18     Temp 99.2 F (37.3 C)     Temp Source Oral     SpO2 100 %     Weight 170 lb (77.1 kg)     Height 5\' 8"  (1.727 m)     Head Circumference      Peak Flow      Pain Score 9     Pain Loc      Pain Edu?      Excl. in GC?     Constitutional: Alert and oriented. Well appearing and in no acute distress. Eyes: Conjunctivae are normal. PERRL. EOMI. Minimal left periorbital hematoma.  Approximately 0.5 cm curved and superficial, well approximated laceration beneath left eye without active bleeding. Head: Atraumatic. Nose: Atraumatic. Mouth/Throat: Mucous membranes are moist.  No dental malocclusion.  Scattered abrasions to upper lip without laceration. Neck: No stridor.  No cervical spine tenderness to palpation.  Bilateral paraspinal muscle spasms.  Cardiovascular: Normal rate, regular rhythm. Grossly normal heart sounds.  Good peripheral circulation. Respiratory: Normal respiratory effort.  No retractions. Lungs CTAB. Gastrointestinal: Soft and nontender. No distention. No abdominal bruits. No CVA tenderness. Musculoskeletal: No spinal tenderness to palpation.  Pelvis is stable.  No lower extremity tenderness nor edema.  No joint effusions.  Scattered abrasions to hands without laceration or active bleeding. Neurologic:  Normal speech and language. No gross focal neurologic deficits are appreciated. No gait instability. Skin:  Skin is warm, dry  and intact. No rash noted. Psychiatric: Mood and affect are normal. Speech and behavior are normal.  ____________________________________________   LABS (all labs ordered are listed, but only abnormal results are displayed)  Labs Reviewed  POC URINE PREG, ED   ____________________________________________  EKG  None ____________________________________________  RADIOLOGY I, Jamilet Ambroise J, personally viewed and evaluated these images (plain radiographs) as part of my medical decision making, as well as reviewing the written report by the radiologist.  ED MD interpretation: No acute cervical spine injury  Official radiology report(s): DG Cervical Spine Complete  Result Date: 02/25/2020 CLINICAL DATA:  Fall, facial injury EXAM: CERVICAL SPINE - COMPLETE 4+ VIEW COMPARISON:  None. FINDINGS: There is no evidence of cervical spine fracture or prevertebral soft tissue swelling. Alignment is normal. No other significant bone abnormalities are identified. IMPRESSION: Negative cervical spine radiographs. Electronically Signed   By: Helyn Numbers MD   On: 02/25/2020 22:47    ____________________________________________   PROCEDURES  Procedure(s) performed (including Critical Care):  Procedures   ____________________________________________   INITIAL IMPRESSION / ASSESSMENT AND PLAN / ED COURSE  As part of my medical decision making, I reviewed the following data within the electronic MEDICAL RECORD NUMBER Nursing notes reviewed and incorporated, Radiograph reviewed and [place] Controlled Substance Database     29 year old female who presents status post mechanical fall with facial laceration, scattered abrasions and neck pain.  X-ray imaging negative for acute cervical spine injury.  Will update tetanus, administer Motrin and Norco for pain, Flexeril for muscle spasms.  Will clean facial laceration and apply Steri-Strips; would not suture as it has been greater than 15 hours since initial  injury.  Strict return precautions given.  Patient verbalizes understanding agrees with plan of care.      ____________________________________________   FINAL CLINICAL IMPRESSION(S) / ED DIAGNOSES  Final diagnoses:  Fall, initial encounter  Facial laceration, initial encounter  Cervical strain, acute, initial encounter  Abrasions of multiple sites     ED Discharge Orders    None      *Please note:  KOURTNIE SACHS was evaluated in Emergency Department on 02/26/2020 for the symptoms described in the history of present illness. She was evaluated in the context of the global COVID-19 pandemic, which necessitated consideration that the patient might be at risk for infection with the SARS-CoV-2 virus that causes COVID-19. Institutional protocols and algorithms that pertain to the evaluation of patients at risk for COVID-19 are in a state of rapid change based on information released by regulatory bodies including the CDC and federal and state organizations. These policies and algorithms were followed during the patient's care in the ED.  Some ED evaluations and interventions may be delayed as a result of limited staffing during and the pandemic.*   Note:  This document was prepared using Dragon voice recognition software and may include unintentional dictation errors.   Irean Hong, MD 02/26/20 [phone]

## 2020-02-26 NOTE — Discharge Instructions (Signed)
1.  Your tetanus has been updated and will be good for 10 years. 2.  You may take medicines as needed for pain and muscle spasms (Motrin/Norco/Flexeril #15). 3.  Steri-Strips will fall off in 5 to 7 days.  You may remove them if there are still adhered after that time. 4.  Apply ice to affected area several times daily to reduce swelling. 5.  Return to the ER for worsening symptoms, persistent vomiting, lethargy, increased redness/swelling/purulent discharge or other concerns.

## 2021-07-10 ENCOUNTER — Emergency Department
Admission: EM | Admit: 2021-07-10 | Discharge: 2021-07-10 | Disposition: A | Discharge status: Home / Self Care | Payer: Self-pay | Attending: Emergency Medicine | Admitting: Emergency Medicine

## 2021-07-10 ENCOUNTER — Other Ambulatory Visit: Payer: Self-pay

## 2021-07-10 ENCOUNTER — Encounter: Payer: Self-pay | Admitting: Emergency Medicine

## 2021-07-10 DIAGNOSIS — L239 Allergic contact dermatitis, unspecified cause: Secondary | ICD-10-CM | POA: Insufficient documentation

## 2021-07-10 DIAGNOSIS — I10 Essential (primary) hypertension: Secondary | ICD-10-CM | POA: Insufficient documentation

## 2021-07-10 MED ORDER — DEXAMETHASONE 4 MG PO TABS
8.0000 mg | ORAL_TABLET | Freq: Once | ORAL | Status: AC
  Administered 2021-07-10: 8 mg via ORAL
  Filled 2021-07-10: qty 2

## 2021-07-10 MED ORDER — HYDROCORTISONE 1 % EX OINT: 1.0000 "application " | TOPICAL_OINTMENT | Freq: Two times a day (BID) | CUTANEOUS | 0 refills | Status: AC

## 2021-07-10 MED ORDER — METHYLPREDNISOLONE 4 MG PO TBPK: ORAL_TABLET | ORAL | 0 refills | Status: AC

## 2021-07-10 NOTE — ED Triage Notes (Signed)
Patient ambulatory to triage with steady gait, without difficulty or distress noted; pt reports generalized itchy rash since Friday; using benadryl, cortisone and calamine with only min relief ?

## 2021-07-10 NOTE — ED Provider Notes (Signed)
? ?[facility] ?Provider Note ? ? ? Event Date/Time  ? First MD Initiated Contact with Patient 07/10/21 0533   ?  (approximate) ? ? ?History  ? ?Rash ? ? ?HPI ? ?Bridget Sanchez is a 31 y.o. female presents for evaluation of a rash.  Patient reports 3 days of erythematous pruritic rash on her extremities and her torso.  The rash started after she was working her yard and came in contact with some vines that were growing next to her house.  She denies mucous membrane involvement, fever, involvement of palms and soles.  She denies any angioedema, throat closing sensation.  She has used p.o. Benadryl, topical cortisone and calamine lotion with minimal relief ?  ? ? ?Past Medical History:  ?Diagnosis Date  ? Anemia   ? Anxiety   ? Hypertension   ? ? ?Past Surgical History:  ?Procedure Laterality Date  ? WISDOM TOOTH EXTRACTION    ? ? ? ?Physical Exam  ? ?Triage Vital Signs: ?ED Triage Vitals  ?Enc Vitals Group  ?   BP 07/10/21 0137 118/84  ?   Pulse Rate 07/10/21 0123 (!) 149  ?   Resp 07/10/21 0123 (!) 30  ?   Temp 07/10/21 0123 99.3 ?F (37.4 ?C)  ?   Temp Source 07/10/21 0123 Rectal  ?   SpO2 07/10/21 0123 94 %  ?   Weight 07/10/21 0122 18 lb 8.1 oz (8.395 kg)  ?   Height --   ?   Head Circumference --   ?   Peak Flow --   ?   Pain Score 07/10/21 0119 0  ?   Pain Loc --   ?   Pain Edu? --   ?   Excl. in GC? --   ? ? ?Most recent vital signs: ?Vitals:  ? 07/10/21 0123 07/10/21 0137  ?BP:  118/84  ?Pulse: (!) 149 86  ?Resp: (!) 30 18  ?Temp: 99.3 ?F (37.4 ?C) 99 ?F (37.2 ?C)  ?SpO2: 94% 100%  ? ? ? ?Constitutional: Alert and oriented. Well appearing and in no apparent distress. ?HEENT: ?     Head: Normocephalic and atraumatic.    ?     Eyes: Conjunctivae are normal. Sclera is non-icteric.  ?     Mouth/Throat: Mucous membranes are moist.  ?     Neck: Supple with no signs of meningismus. ?Cardiovascular: Regular rate and rhythm.  ?Respiratory: Normal respiratory effort.  ?Musculoskeletal:  No edema,  cyanosis, or erythema of extremities. ?Neurologic: Normal speech and language. Face is symmetric. Moving all extremities. No gross focal neurologic deficits are appreciated. ?Skin: Skin is warm, dry and intact. Linear clusters of small blisters which are thematous and blanching located in her extremities and torso ?Psychiatric: Mood and affect are normal. Speech and behavior are normal. ? ?ED Results / Procedures / Treatments  ? ?Labs ?(all labs ordered are listed, but only abnormal results are displayed) ?Labs Reviewed - No data to display ? ? ?EKG ? ?none ? ? ?RADIOLOGY ?none ? ? ?PROCEDURES: ? ?Critical Care performed: No ? ?Procedures ? ? ? ?IMPRESSION / MDM / ASSESSMENT AND PLAN / ED COURSE  ?I reviewed the triage vital signs and the nursing notes. ? ?31 y.o. female presents for evaluation of a rash.  Patient presents with 3 days of a pruritic rash.  The rash looks like contact dermatitis.  We will send patient home on a Medrol taper.  Recommended continuing Benadryl and topical  calamine lotion.  Discussed my standard return precautions.  No signs of anaphylaxis, no mucous membrane involvement, no palms and sole involvement, no fever ? ? ?MEDICATIONS GIVEN IN ED: ?Medications  ?dexamethasone (DECADRON) tablet 8 mg (has no administration in time range)  ? ?EMR reviewed none ? ? ? ?FINAL CLINICAL IMPRESSION(S) / ED DIAGNOSES  ? ?Final diagnoses:  ?Allergic contact dermatitis, unspecified trigger  ? ? ? ?Rx / DC Orders  ? ?ED Discharge Orders   ? ?      Ordered  ?  methylPREDNISolone (MEDROL DOSEPAK) 4 MG TBPK tablet       ? 07/10/21 0538  ? ?  ?  ? ?  ? ? ? ?Note:  This document was prepared using Dragon voice recognition software and may include unintentional dictation errors. ? ? ?Please note:  Patient was evaluated in Emergency Department today for the symptoms described in the history of present illness. Patient was evaluated in the context of the global COVID-19 pandemic, which necessitated consideration  that the patient might be at risk for infection with the SARS-CoV-2 virus that causes COVID-19. Institutional protocols and algorithms that pertain to the evaluation of patients at risk for COVID-19 are in a state of rapid change based on information released by regulatory bodies including the CDC and federal and state organizations. These policies and algorithms were followed during the patient's care in the ED.  Some ED evaluations and interventions may be delayed as a result of limited staffing during the pandemic. ? ? ? ? ?  ?Nita Sickle, MD ?07/10/21 [phone] ? ?

## 2023-07-14 ENCOUNTER — Ambulatory Visit
Admission: EM | Admit: 2023-07-14 | Discharge: 2023-07-14 | Disposition: A | Discharge status: Home / Self Care | Attending: Emergency Medicine | Admitting: Emergency Medicine

## 2023-07-14 DIAGNOSIS — H579 Unspecified disorder of eye and adnexa: Secondary | ICD-10-CM

## 2023-07-14 DIAGNOSIS — R519 Headache, unspecified: Secondary | ICD-10-CM | POA: Diagnosis not present

## 2023-07-14 LAB — POCT RAPID STREP A (OFFICE): Rapid Strep A Screen: NEGATIVE

## 2023-07-14 NOTE — ED Triage Notes (Signed)
 Patient to Urgent Care with complaints of left sided headache/ light sensitivity/ pressure.   Symptoms started last night. Woke up this morning- states she heard a "pop" on the left side followed by a burning. States this has improved but is still having some burning pain behind her left eye.   No otc meds.   Also reports sore throat x3 days/ denies any fevers.

## 2023-07-14 NOTE — ED Notes (Signed)
 Patient is being discharged from the Urgent Care and sent to the Emergency Department via POV . Per Wendee Beavers, patient is in need of higher level of care due to head pressure. Patient is aware and verbalizes understanding of plan of care.  Vitals:   07/14/23 1003  BP: 134/89  Pulse: 79  Resp: 18  Temp: 98.6 F (37 C)  SpO2: 100%

## 2023-07-14 NOTE — Discharge Instructions (Signed)
 Go to the emergency department for evaluation of the headache and pressure behind your left eye.

## 2023-07-14 NOTE — ED Provider Notes (Signed)
 Renaldo Fiddler    CSN: [id number] Arrival date & time: 07/14/23  0854      History   Chief Complaint Chief Complaint  Patient presents with   Headache    HPI Bridget Sanchez is a 33 y.o. female.  Patient presents with headache since last night.  No trauma.  She woke up this morning with a headache still.  She felt and heard a "pop" in the left side of her head and has had pressure behind her left eye since then.  She reports some blurred vision that has since resolved.  She denies numbness, weakness, dizziness, current vision change, chest pain, shortness of breath.  Patient also reports 3-day history of sore throat.  No OTC medications taken today.  Her medical history includes hypertension.  The history is provided by the patient and medical records.    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      Home Medications    Prior to Admission medications   Medication Sig Start Date End Date Taking? Authorizing Provider  albuterol (PROVENTIL HFA;VENTOLIN HFA) 108 (90 Base) MCG/ACT inhaler Inhale 2 puffs into the lungs every 6 (six) hours as needed for wheezing or shortness of breath. Patient not taking: Reported on 07/14/2023 10/02/17   Willy Eddy, MD  cyclobenzaprine (FLEXERIL) 5 MG tablet 1 tablet every 8 hours as needed for muscle spasms Patient not taking: Reported on 07/14/2023 02/26/20   Irean Hong, MD  HYDROcodone-acetaminophen (NORCO) 5-325 MG tablet Take 1 tablet by mouth every 6 (six) hours as needed for moderate pain. Patient not taking: Reported on 07/14/2023 02/26/20   Irean Hong, MD  hydrocortisone 1 % ointment Apply 1 application. topically 2 (two)  times daily. Patient not taking: Reported on 07/14/2023 07/10/21   Nita Sickle, MD  ibuprofen (ADVIL) 800 MG tablet Take 1 tablet (800 mg total) by mouth every 8 (eight) hours as needed for moderate pain. Patient not taking: Reported on 07/14/2023 02/26/20   Irean Hong, MD  methylPREDNISolone (MEDROL DOSEPAK) 4 MG TBPK tablet Take as prescribed Patient not taking: Reported on 07/14/2023 07/10/21   Nita Sickle, MD    Family History History reviewed. No pertinent family history.  Social History Social History   Tobacco Use   Smoking status: Every Day    Types: Cigarettes   Smokeless tobacco: Never  Vaping Use   Vaping status: Never Used  Substance Use Topics   Alcohol use: Not Currently    Comment: socially   Drug use: Not Currently    Types: Marijuana     Allergies   Patient has no known allergies.   Review of Systems Review of Systems  Constitutional:  Negative for chills and fever.  HENT:  Positive for sore throat. Negative for ear pain.   Eyes:  Positive for pain and visual disturbance.  Respiratory:  Negative for cough and shortness of breath.   Cardiovascular:  Negative for chest pain and palpitations.  Neurological:  Positive for headaches. Negative for dizziness, syncope, facial asymmetry, speech difficulty, weakness and numbness.     Physical Exam Triage Vital Signs  ED Triage Vitals  Encounter Vitals Group     BP 07/14/23 1003 134/89     Systolic BP Percentile --      Diastolic BP Percentile --      Pulse Rate 07/14/23 1003 79     Resp 07/14/23 1003 18     Temp 07/14/23 1003 98.6 F (37 C)     Temp src --      SpO2 07/14/23 1003 100 %     Weight --      Height --      Head Circumference --      Peak Flow --      Pain Score 07/14/23 1009 6     Pain Loc --      Pain Education --      Exclude from Growth Chart --    No data found.  Updated Vital Signs BP 134/89   Pulse 79   Temp 98.6 F (37 C)   Resp 18   LMP 07/13/2023   SpO2  100%   Visual Acuity Right Eye Distance:   Left Eye Distance:   Bilateral Distance:    Right Eye Near:   Left Eye Near:    Bilateral Near:     Physical Exam Constitutional:      General: She is not in acute distress. HENT:     Right Ear: Tympanic membrane normal.     Left Ear: Tympanic membrane normal.     Nose: Nose normal.     Mouth/Throat:     Mouth: Mucous membranes are moist.     Pharynx: Oropharynx is clear.  Eyes:     General:        Right eye: No discharge.        Left eye: No discharge.     Extraocular Movements: Extraocular movements intact.     Conjunctiva/sclera: Conjunctivae normal.     Pupils: Pupils are equal, round, and reactive to light.  Cardiovascular:     Rate and Rhythm: Normal rate and regular rhythm.     Heart sounds: Normal heart sounds.  Pulmonary:     Effort: Pulmonary effort is normal. No respiratory distress.     Breath sounds: Normal breath sounds.  Neurological:     General: No focal deficit present.     Mental Status: She is alert and oriented to person, place, and time.     Cranial Nerves: No cranial nerve deficit.     Sensory: No sensory deficit.     Motor: No weakness.     Gait: Gait normal.  Psychiatric:        Mood and Affect: Mood normal.        Behavior: Behavior normal.      UC Treatments / Results  Labs (all labs ordered are listed, but only abnormal results are displayed) Labs Reviewed  POCT RAPID STREP A (OFFICE)    EKG   Radiology No results found.  Procedures Procedures (including critical care time)  Medications Ordered in UC Medications - No data to display  Initial Impression / Assessment and Plan / UC Course  I have reviewed the triage vital signs and the nursing notes.  Pertinent labs & imaging results that were available during my care of the patient were reviewed by me and considered in my medical decision making (see chart for details).    Headache and pressure behind left eye.  No trauma.   Patient has had the headache since last night.  She reports she  heard and felt a "pop" inside of her head on the left side this morning.  She has had pressure behind her left eye since then.  She had blurred vision but this has resolved.  No history of headache disorder.  Sending patient to the ED for evaluation.  Afebrile and vital signs are stable.  No focal weakness.  She agrees to plan of care and will go to [facility] ED.  Final Clinical Impressions(s) / UC Diagnoses   Final diagnoses:  Eye pressure  Acute nonintractable headache, unspecified headache type     Discharge Instructions      Go to the emergency department for evaluation of the headache and pressure behind your left eye.     ED Prescriptions   None    PDMP not reviewed this encounter.   Mickie Bail, NP 07/14/23 1036

## 2023-07-18 DIAGNOSIS — H5213 Myopia, bilateral: Secondary | ICD-10-CM | POA: Diagnosis not present

## 2023-07-18 DIAGNOSIS — H1045 Other chronic allergic conjunctivitis: Secondary | ICD-10-CM | POA: Diagnosis not present

## 2023-07-18 DIAGNOSIS — H02884 Meibomian gland dysfunction left upper eyelid: Secondary | ICD-10-CM | POA: Diagnosis not present

## 2023-07-18 DIAGNOSIS — H52223 Regular astigmatism, bilateral: Secondary | ICD-10-CM | POA: Diagnosis not present

## 2023-07-21 DIAGNOSIS — H5213 Myopia, bilateral: Secondary | ICD-10-CM | POA: Diagnosis not present

## 2023-08-27 DIAGNOSIS — H52223 Regular astigmatism, bilateral: Secondary | ICD-10-CM | POA: Diagnosis not present

## 2023-08-30 ENCOUNTER — Other Ambulatory Visit: Payer: Self-pay

## 2023-08-30 ENCOUNTER — Emergency Department
Admission: EM | Admit: 2023-08-30 | Discharge: 2023-08-30 | Disposition: A | Discharge status: Home / Self Care | Attending: Emergency Medicine | Admitting: Emergency Medicine

## 2023-08-30 ENCOUNTER — Encounter: Payer: Self-pay | Admitting: Emergency Medicine

## 2023-08-30 DIAGNOSIS — S1091XA Abrasion of unspecified part of neck, initial encounter: Secondary | ICD-10-CM | POA: Insufficient documentation

## 2023-08-30 DIAGNOSIS — S0993XA Unspecified injury of face, initial encounter: Secondary | ICD-10-CM | POA: Diagnosis present

## 2023-08-30 DIAGNOSIS — S00511A Abrasion of lip, initial encounter: Secondary | ICD-10-CM | POA: Insufficient documentation

## 2023-08-30 DIAGNOSIS — I10 Essential (primary) hypertension: Secondary | ICD-10-CM | POA: Diagnosis not present

## 2023-08-30 NOTE — Discharge Instructions (Addendum)
 You may apply ice to your lip for 20 minutes every 2 hours or so as needed for swelling/discomfort.  Please use Tylenol  or ibuprofen  if needed, as written on the box.  Return to the emergency department if you feel unsafe at any time.  Otherwise please follow-up with your doctor.
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
## 2024-05-13 ENCOUNTER — Ambulatory Visit
Admission: EM | Admit: 2024-05-13 | Discharge: 2024-05-13 | Disposition: A | Discharge status: Home / Self Care | Attending: Emergency Medicine | Admitting: Emergency Medicine

## 2024-05-13 ENCOUNTER — Ambulatory Visit: Payer: Self-pay

## 2024-05-13 DIAGNOSIS — Z113 Encounter for screening for infections with a predominantly sexual mode of transmission: Secondary | ICD-10-CM | POA: Diagnosis present

## 2024-05-13 DIAGNOSIS — Z3202 Encounter for pregnancy test, result negative: Secondary | ICD-10-CM | POA: Diagnosis present

## 2024-05-13 DIAGNOSIS — Z202 Contact with and (suspected) exposure to infections with a predominantly sexual mode of transmission: Secondary | ICD-10-CM | POA: Insufficient documentation

## 2024-05-13 LAB — POCT URINE PREGNANCY: Preg Test, Ur: NEGATIVE

## 2024-05-13 NOTE — ED Triage Notes (Signed)
 Pt would like to have std testing down today. No current symptoms.

## 2024-05-13 NOTE — ED Provider Notes (Addendum)
 " Bridget Sanchez    CSN: [id number] Arrival date & time: 05/13/24  0809      History   Chief Complaint Chief Complaint  Patient presents with   Exposure to STD    HPI Bridget Sanchez is a 34 y.o. female.  Patient presents with request for STD check.  She states a former sexual partner sent her a message that she needed to be checked for STDs, specifically HPV.  The patient reports she does not have any rash, sores, vaginal discharge, pelvic pain, abdominal pain, dysuria, hematuria, or other symptoms.  She states she has been vaccinated for HPV.  The history is provided by the patient and medical records.    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      Home Medications    Prior to Admission medications  Medication Sig Start Date End Date Taking? Authorizing Provider  albuterol  (PROVENTIL  HFA;VENTOLIN  HFA) 108 (90 Base) MCG/ACT inhaler Inhale 2 puffs into the lungs every 6 (six) hours as needed for wheezing or shortness of breath. Patient not taking: Reported on 07/14/2023 10/02/17   Lang Dover, MD  cyclobenzaprine  (FLEXERIL ) 5 MG tablet 1 tablet every 8 hours as needed for muscle spasms Patient not taking: Reported on 07/14/2023 02/26/20   Sung, Jade J, MD  HYDROcodone -acetaminophen  (NORCO) 5-325 MG tablet Take 1 tablet by mouth every 6 (six) hours as needed for moderate pain. Patient not taking: Reported on 07/14/2023 02/26/20   Sung, Jade J, MD  hydrocortisone  1 % ointment Apply 1 application. topically 2 (two) times daily. Patient not taking: Reported on 07/14/2023 07/10/21   Edelmiro Leash, MD  ibuprofen  (ADVIL ) 800 MG tablet Take 1 tablet (800 mg total) by  mouth every 8 (eight) hours as needed for moderate pain. Patient not taking: Reported on 07/14/2023 02/26/20   Sung, Jade J, MD  methylPREDNISolone  (MEDROL  DOSEPAK) 4 MG TBPK tablet Take as prescribed Patient not taking: Reported on 07/14/2023 07/10/21   Edelmiro Leash, MD    Family History History reviewed. No pertinent family history.  Social History Social History[1]   Allergies   Patient has no known allergies.   Review of Systems Review of Systems  Constitutional:  Negative for chills and fever.  Gastrointestinal:  Negative for abdominal pain.  Genitourinary:  Negative for dysuria, flank pain, genital sores, hematuria, pelvic pain and vaginal discharge.     Physical Exam Triage Vital Signs ED Triage Vitals [05/13/24 0826]  Encounter Vitals Group     BP (!) 132/92     Girls Systolic BP Percentile      Girls Diastolic BP Percentile      Boys Systolic BP Percentile      Boys Diastolic BP Percentile      Pulse Rate 86     Resp 18     Temp 98.1 F (36.7 C)     Temp src      SpO2 99 %     Weight      Height      Head Circumference      Peak Flow      Pain Score  Pain Loc      Pain Education      Exclude from Growth Chart    No data found.  Updated Vital Signs BP (!) 132/92   Pulse 86   Temp 98.1 F (36.7 C)   Resp 18   LMP 04/27/2024 (Approximate)   SpO2 99%   Visual Acuity Right Eye Distance:   Left Eye Distance:   Bilateral Distance:    Right Eye Near:   Left Eye Near:    Bilateral Near:     Physical Exam Constitutional:      General: She is not in acute distress. HENT:     Mouth/Throat:     Mouth: Mucous membranes are moist.  Cardiovascular:     Rate and Rhythm: Normal rate.  Pulmonary:     Effort: Pulmonary effort is normal. No respiratory distress.  Abdominal:     General: Bowel sounds are normal.     Palpations: Abdomen is soft.     Tenderness: There is no abdominal tenderness. There is no right CVA tenderness, left CVA  tenderness, guarding or rebound.  Genitourinary:    Comments: Patient declines GU exam. Neurological:     Mental Status: She is alert.      UC Treatments / Results  Labs (all labs ordered are listed, but only abnormal results are displayed) Labs Reviewed  SYPHILIS: RPR W/REFLEX TO RPR TITER AND TREPONEMAL ANTIBODIES, TRADITIONAL SCREENING AND DIAGNOSIS ALGORITHM  HIV ANTIBODY (ROUTINE TESTING W REFLEX)  POCT URINE PREGNANCY  CERVICOVAGINAL ANCILLARY ONLY    EKG   Radiology No results found.  Procedures Procedures (including critical care time)  Medications Ordered in UC Medications - No data to display  Initial Impression / Assessment and Plan / UC Course  I have reviewed the triage vital signs and the nursing notes.  Pertinent labs & imaging results that were available during my care of the patient were reviewed by me and considered in my medical decision making (see chart for details).    Possible exposure to STD, STD screening, negative pregnancy test.  Patient is currently asymptomatic.  Discussed the STD tests that are available here.  Patient obtained vaginal self swab for testing.  Blood work also pending.  Discussed with patient that her test results will be available in her MyChart account and that we will call if treatment is needed.  Instructed her to abstain from all sexual activity until all test results are back and treatment is completed if needed.  Instructed patient to follow-up with her PCP or gynecologist.  She agrees to plan of care.  Final Clinical Impressions(s) / UC Diagnoses   Final diagnoses:  Screening for STD (sexually transmitted disease)  Negative pregnancy test  Possible exposure to STD     Discharge Instructions      Your urine pregnancy test is negative.   Your tests are pending.  Your test results will be available on your MyChart account.  If your test results are positive, we will call you.  You and your sexual partner(s) may  require treatment at that time.  Do not have sexual activity until all test results are back and treatment is completed if needed. Follow-up with your primary care provider or gynecologist.      ED Prescriptions   None    PDMP not reviewed this encounter.    Corlis Burnard DEL, NP 05/13/24 [phone]     [1]  Social History Tobacco Use   Smoking status: Every Day  Types: Cigarettes   Smokeless tobacco: Never  Vaping Use   Vaping status: Never Used  Substance Use Topics   Alcohol use: Not Currently    Comment: socially   Drug use: Not Currently    Types: Marijuana     Corlis Burnard DEL, NP 05/13/24 [phone]  "

## 2024-05-13 NOTE — Discharge Instructions (Addendum)
 Your urine pregnancy test is negative.   Your tests are pending.  Your test results will be available on your MyChart account.  If your test results are positive, we will call you.  You and your sexual partner(s) may require treatment at that time.  Do not have sexual activity until all test results are back and treatment is completed if needed. Follow-up with your primary care provider or gynecologist.

## 2024-05-14 LAB — CERVICOVAGINAL ANCILLARY ONLY
Chlamydia: NEGATIVE
Comment: NEGATIVE
Comment: NEGATIVE
Comment: NORMAL
Neisseria Gonorrhea: NEGATIVE
Trichomonas: POSITIVE — AB

## 2024-05-14 LAB — HIV ANTIBODY (ROUTINE TESTING W REFLEX): HIV Screen 4th Generation wRfx: NONREACTIVE

## 2024-05-14 LAB — SYPHILIS: RPR W/REFLEX TO RPR TITER AND TREPONEMAL ANTIBODIES, TRADITIONAL SCREENING AND DIAGNOSIS ALGORITHM: RPR Ser Ql: NONREACTIVE

## 2024-05-16 ENCOUNTER — Ambulatory Visit (HOSPITAL_COMMUNITY): Payer: Self-pay

## 2024-05-16 MED ORDER — METRONIDAZOLE 500 MG PO TABS: 500.0000 mg | ORAL_TABLET | Freq: Two times a day (BID) | ORAL | 0 refills | Status: AC

## 2024-05-17 ENCOUNTER — Ambulatory Visit: Payer: Self-pay
